# Patient Record
Sex: Female | Born: 1995 | Race: Black or African American | Hispanic: No | Marital: Single | Smoking: Never smoker
Health system: Southern US, Community
[De-identification: ages and names within clinical notes are randomized; demographics above are authoritative.]

## PROBLEM LIST (undated history)

## (undated) DIAGNOSIS — J302 Other seasonal allergic rhinitis: Secondary | ICD-10-CM

## (undated) DIAGNOSIS — F32A Depression, unspecified: Secondary | ICD-10-CM

## (undated) DIAGNOSIS — F411 Generalized anxiety disorder: Secondary | ICD-10-CM

## (undated) DIAGNOSIS — F329 Major depressive disorder, single episode, unspecified: Secondary | ICD-10-CM

## (undated) HISTORY — DX: Generalized anxiety disorder: F41.1

## (undated) HISTORY — DX: Other seasonal allergic rhinitis: J30.2

## (undated) HISTORY — PX: WISDOM TOOTH EXTRACTION: SHX21

---

## 2016-08-20 DIAGNOSIS — J101 Influenza due to other identified influenza virus with other respiratory manifestations: Secondary | ICD-10-CM | POA: Diagnosis not present

## 2016-08-22 DIAGNOSIS — J111 Influenza due to unidentified influenza virus with other respiratory manifestations: Secondary | ICD-10-CM | POA: Diagnosis not present

## 2016-08-25 ENCOUNTER — Emergency Department (HOSPITAL_BASED_OUTPATIENT_CLINIC_OR_DEPARTMENT_OTHER)
Admission: EM | Admit: 2016-08-25 | Discharge: 2016-08-26 | Disposition: A | Discharge status: Home / Self Care | Payer: BLUE CROSS/BLUE SHIELD | Attending: Emergency Medicine | Admitting: Emergency Medicine

## 2016-08-25 ENCOUNTER — Encounter (HOSPITAL_BASED_OUTPATIENT_CLINIC_OR_DEPARTMENT_OTHER): Payer: Self-pay

## 2016-08-25 ENCOUNTER — Emergency Department (HOSPITAL_BASED_OUTPATIENT_CLINIC_OR_DEPARTMENT_OTHER): Payer: BLUE CROSS/BLUE SHIELD

## 2016-08-25 DIAGNOSIS — R0602 Shortness of breath: Secondary | ICD-10-CM | POA: Diagnosis not present

## 2016-08-25 DIAGNOSIS — N3000 Acute cystitis without hematuria: Secondary | ICD-10-CM

## 2016-08-25 DIAGNOSIS — Z79899 Other long term (current) drug therapy: Secondary | ICD-10-CM | POA: Diagnosis not present

## 2016-08-25 DIAGNOSIS — R079 Chest pain, unspecified: Secondary | ICD-10-CM | POA: Diagnosis not present

## 2016-08-25 DIAGNOSIS — R05 Cough: Secondary | ICD-10-CM | POA: Diagnosis not present

## 2016-08-25 HISTORY — DX: Depression, unspecified: F32.A

## 2016-08-25 HISTORY — DX: Major depressive disorder, single episode, unspecified: F32.9

## 2016-08-25 LAB — CBC WITH DIFFERENTIAL/PLATELET
Basophils Absolute: 0 10*3/uL (ref 0.0–0.1)
Basophils Relative: 0 %
EOS PCT: 0 %
Eosinophils Absolute: 0 10*3/uL (ref 0.0–0.7)
HEMATOCRIT: 34.1 % — AB (ref 36.0–46.0)
HEMOGLOBIN: 11.5 g/dL — AB (ref 12.0–15.0)
LYMPHS PCT: 18 %
Lymphs Abs: 2.3 10*3/uL (ref 0.7–4.0)
MCH: 27.6 pg (ref 26.0–34.0)
MCHC: 33.7 g/dL (ref 30.0–36.0)
MCV: 82 fL (ref 78.0–100.0)
MONOS PCT: 11 %
Monocytes Absolute: 1.4 10*3/uL — ABNORMAL HIGH (ref 0.1–1.0)
NEUTROS PCT: 71 %
Neutro Abs: 8.8 10*3/uL — ABNORMAL HIGH (ref 1.7–7.7)
Platelets: 268 10*3/uL (ref 150–400)
RBC: 4.16 MIL/uL (ref 3.87–5.11)
RDW: 12.5 % (ref 11.5–15.5)
WBC: 12.5 10*3/uL — ABNORMAL HIGH (ref 4.0–10.5)

## 2016-08-25 LAB — COMPREHENSIVE METABOLIC PANEL
ALT: 18 U/L (ref 14–54)
ANION GAP: 8 (ref 5–15)
AST: 21 U/L (ref 15–41)
Albumin: 3.3 g/dL — ABNORMAL LOW (ref 3.5–5.0)
Alkaline Phosphatase: 64 U/L (ref 38–126)
BUN: 5 mg/dL — AB (ref 6–20)
CHLORIDE: 101 mmol/L (ref 101–111)
CO2: 28 mmol/L (ref 22–32)
Calcium: 8.7 mg/dL — ABNORMAL LOW (ref 8.9–10.3)
Creatinine, Ser: 0.73 mg/dL (ref 0.44–1.00)
Glucose, Bld: 112 mg/dL — ABNORMAL HIGH (ref 65–99)
POTASSIUM: 4 mmol/L (ref 3.5–5.1)
Sodium: 137 mmol/L (ref 135–145)
TOTAL PROTEIN: 7.7 g/dL (ref 6.5–8.1)
Total Bilirubin: 0.4 mg/dL (ref 0.3–1.2)

## 2016-08-25 LAB — I-STAT CG4 LACTIC ACID, ED: LACTIC ACID, VENOUS: 1.15 mmol/L (ref 0.5–1.9)

## 2016-08-25 MED ORDER — ACETAMINOPHEN 325 MG PO TABS
ORAL_TABLET | ORAL | Status: AC
  Filled 2016-08-25: qty 1

## 2016-08-25 MED ORDER — ACETAMINOPHEN 325 MG PO TABS
650.0000 mg | ORAL_TABLET | Freq: Once | ORAL | Status: AC
  Administered 2016-08-25: 650 mg via ORAL
  Filled 2016-08-25: qty 2

## 2016-08-25 NOTE — ED Triage Notes (Signed)
C/o flu like s/s x 8 days-seen by PCP-neg flu test but was started on tamiflu-did not compete med-NAD-steady gait

## 2016-08-25 NOTE — ED Provider Notes (Signed)
[facility] Provider Note   CSN: [id number] Arrival date & time: 08/25/16  2055   By signing my name below, I, Vanessa Christian, attest that this documentation has been prepared under the direction and in the presence of Vanessa Morn, NP Electronically Signed: Soijett Christian, ED Scribe. 08/25/16. 11:51 PM.  History   Chief Complaint Chief Complaint  Patient presents with  . Cough    HPI Vanessa Christian is a 21 y.o. female who presents to the Emergency Department complaining of productive cough onset 8 days ago. Pt reports associated fever of 103, chills, nausea, vomiting, HA, CP, back pain, SOB, appetite change, and generalized body aches. Pt has tried tamiflu with no relief of her symptoms. Pt notes that she was evaluated at her PCP for her symptoms and had a negative flu swab at the time. Pt states that she was treated with tamiflu to which she didn't complete the prescription. Mother states that the pt was evaluated at [facility] as well for her symptoms. She denies sore throat and any other symptoms. Mother notes that the pt is allergic to penicillin.   The history is provided by the patient and a parent. No language interpreter was used.  Cough  This is a new problem. The current episode started more than 1 week ago. The problem occurs constantly. The problem has not changed since onset.The cough is productive of sputum. The maximum temperature recorded prior to her arrival was 103 to 104 F. Associated symptoms include chest pain, chills, myalgias and shortness of breath. Pertinent negatives include no sore throat. Treatments tried: tamiflu. The treatment provided no relief.    Past Medical History:  Diagnosis Date  . Depression     There are no active problems to display for this patient.   Past Surgical History:  Procedure Laterality Date  . WISDOM TOOTH EXTRACTION      OB History    No data available       Home Medications    Prior to Admission  medications   Medication Sig Start Date End Date Taking? Authorizing Provider  Citalopram Hydrobromide (CELEXA PO) Take by mouth.   Yes Historical Provider, MD  MELOXICAM PO Take by mouth.   Yes Historical Provider, MD    Family History No family history on file.  Social History Social History  Substance Use Topics  . Smoking status: Never Smoker  . Smokeless tobacco: Never Used  . Alcohol use No     Allergies   Penicillins   Review of Systems Review of Systems  Constitutional: Positive for appetite change, chills and fever.  HENT: Negative for sore throat.   Respiratory: Positive for cough and shortness of breath.   Cardiovascular: Positive for chest pain.  Gastrointestinal: Positive for nausea and vomiting.  Musculoskeletal: Positive for back pain and myalgias.  All other systems reviewed and are negative.    Physical Exam Updated Vital Signs BP 135/65 (BP Location: Right Arm)   Pulse 104   Temp (!) 103.9 F (39.9 C) (Oral)   Resp (!) 30   LMP 08/13/2016   SpO2 100%   Physical Exam  Constitutional: She is oriented to person, place, and time. She appears well-developed and well-nourished. No distress.  HENT:  Head: Normocephalic and atraumatic.  Mouth/Throat: Uvula is midline, oropharynx is clear and moist and mucous membranes are normal.  Eyes: EOM are normal.  Neck: Neck supple.  Cardiovascular: Regular rhythm and normal heart sounds.  Tachycardia present.  Exam reveals no gallop  and no friction rub.   No murmur heard. Pulmonary/Chest: Effort normal and breath sounds normal. No respiratory distress. She has no wheezes. She has no rales.  Abdominal: Soft. She exhibits no distension. There is no tenderness.  Musculoskeletal: Normal range of motion.  Neurological: She is alert and oriented to person, place, and time.  Skin: Skin is warm and dry.  Hot to touch  Psychiatric: She has a normal mood and affect. Her behavior is normal.  Nursing note and vitals  reviewed.    ED Treatments / Results  DIAGNOSTIC STUDIES: Oxygen Saturation is 100% on RA, nl by my interpretation.    COORDINATION OF CARE: 10:53 PM Discussed treatment plan with pt at bedside which includes labs, UA, CXR, and pt agreed to plan.   Labs (all labs ordered are listed, but only abnormal results are displayed) Labs Reviewed  COMPREHENSIVE METABOLIC PANEL - Abnormal; Notable for the following:       Result Value   Glucose, Bld 112 (*)    BUN 5 (*)    Calcium 8.7 (*)    Albumin 3.3 (*)    All other components within normal limits  CBC WITH DIFFERENTIAL/PLATELET - Abnormal; Notable for the following:    WBC 12.5 (*)    Hemoglobin 11.5 (*)    HCT 34.1 (*)    Neutro Abs 8.8 (*)    Monocytes Absolute 1.4 (*)    All other components within normal limits  URINALYSIS, ROUTINE W REFLEX MICROSCOPIC - Abnormal; Notable for the following:    APPearance CLOUDY (*)    Hgb urine dipstick TRACE (*)    Nitrite POSITIVE (*)    Leukocytes, UA LARGE (*)    All other components within normal limits  URINALYSIS, MICROSCOPIC (REFLEX) - Abnormal; Notable for the following:    Bacteria, UA MANY (*)    Squamous Epithelial / LPF 0-5 (*)    All other components within normal limits  I-STAT CG4 LACTIC ACID, ED    Radiology Dg Chest 2 View  Result Date: 08/25/2016 CLINICAL DATA:  Flu-like symptoms for 8 days.  Cough. EXAM: CHEST  2 VIEW COMPARISON:  None. FINDINGS: The heart size and mediastinal contours are within normal limits. Both lungs are clear. The visualized skeletal structures are unremarkable. IMPRESSION: No active cardiopulmonary disease. Electronically Signed   By: Burman NievesWilliam  Stevens M.D.   On: 08/25/2016 22:11    Procedures Procedures (including critical care time)  Medications Ordered in ED Medications  acetaminophen (TYLENOL) 325 MG tablet (not administered)  acetaminophen (TYLENOL) tablet 650 mg (650 mg Oral Given 08/25/16 2224)     Initial Impression / Assessment  and Plan / ED Course  I have reviewed the triage vital signs and the nursing notes.  Pertinent labs & imaging results that were available during my care of the patient were reviewed by me and considered in my medical decision making (see chart for details).    Pt diagnosed with a UTI and viral illness. Tachycardia and fever improved with treatment in ED. Pt to be dc home with antibiotics and instructions to follow up with PCP if symptoms persist. Discussed return precautions. Pt appears safe for discharge.    Final Clinical Impressions(s) / ED Diagnoses   Final diagnoses:  Acute cystitis without hematuria    New Prescriptions New Prescriptions   CEPHALEXIN (KEFLEX) 500 MG CAPSULE    Take 1 capsule (500 mg total) by mouth 4 (four) times daily.   ONDANSETRON (ZOFRAN ODT) 4 MG DISINTEGRATING TABLET  4mg  ODT q4 hours prn nausea/vomit   I personally performed the services described in this documentation, which was scribed in my presence. The recorded information has been reviewed and is accurate.     Vanessa Morn, NP 08/26/16 1610    Charlynne Pander, MD 08/28/16 [phone]

## 2016-08-25 NOTE — ED Notes (Signed)
ED Provider at bedside. 

## 2016-08-26 LAB — URINALYSIS, ROUTINE W REFLEX MICROSCOPIC
Bilirubin Urine: NEGATIVE
GLUCOSE, UA: NEGATIVE mg/dL
KETONES UR: NEGATIVE mg/dL
Nitrite: POSITIVE — AB
PH: 6.5 (ref 5.0–8.0)
Protein, ur: NEGATIVE mg/dL
Specific Gravity, Urine: 1.01 (ref 1.005–1.030)

## 2016-08-26 LAB — URINALYSIS, MICROSCOPIC (REFLEX)

## 2016-08-26 MED ORDER — CEPHALEXIN 500 MG PO CAPS: 500.0000 mg | ORAL_CAPSULE | Freq: Four times a day (QID) | ORAL | 0 refills | Status: DC

## 2016-08-26 MED ORDER — ONDANSETRON 4 MG PO TBDP: ORAL_TABLET | ORAL | 0 refills | Status: DC

## 2016-11-02 DIAGNOSIS — N760 Acute vaginitis: Secondary | ICD-10-CM | POA: Diagnosis not present

## 2016-11-04 DIAGNOSIS — M542 Cervicalgia: Secondary | ICD-10-CM | POA: Diagnosis not present

## 2016-11-04 DIAGNOSIS — N62 Hypertrophy of breast: Secondary | ICD-10-CM | POA: Diagnosis not present

## 2016-11-04 DIAGNOSIS — M549 Dorsalgia, unspecified: Secondary | ICD-10-CM | POA: Diagnosis not present

## 2016-11-04 DIAGNOSIS — L304 Erythema intertrigo: Secondary | ICD-10-CM | POA: Insufficient documentation

## 2016-11-17 DIAGNOSIS — E668 Other obesity: Secondary | ICD-10-CM | POA: Diagnosis not present

## 2016-11-17 DIAGNOSIS — B373 Candidiasis of vulva and vagina: Secondary | ICD-10-CM | POA: Diagnosis not present

## 2016-11-25 DIAGNOSIS — R875 Abnormal microbiological findings in specimens from female genital organs: Secondary | ICD-10-CM | POA: Diagnosis not present

## 2016-11-25 DIAGNOSIS — N939 Abnormal uterine and vaginal bleeding, unspecified: Secondary | ICD-10-CM | POA: Diagnosis not present

## 2016-11-25 DIAGNOSIS — R87610 Atypical squamous cells of undetermined significance on cytologic smear of cervix (ASC-US): Secondary | ICD-10-CM | POA: Diagnosis not present

## 2017-01-07 DIAGNOSIS — E668 Other obesity: Secondary | ICD-10-CM | POA: Diagnosis not present

## 2017-01-07 DIAGNOSIS — F411 Generalized anxiety disorder: Secondary | ICD-10-CM | POA: Diagnosis not present

## 2017-01-12 DIAGNOSIS — N342 Other urethritis: Secondary | ICD-10-CM | POA: Diagnosis not present

## 2017-02-15 DIAGNOSIS — F331 Major depressive disorder, recurrent, moderate: Secondary | ICD-10-CM | POA: Diagnosis not present

## 2017-03-09 DIAGNOSIS — G43119 Migraine with aura, intractable, without status migrainosus: Secondary | ICD-10-CM | POA: Diagnosis not present

## 2017-03-29 DIAGNOSIS — F331 Major depressive disorder, recurrent, moderate: Secondary | ICD-10-CM | POA: Diagnosis not present

## 2017-05-03 DIAGNOSIS — J209 Acute bronchitis, unspecified: Secondary | ICD-10-CM | POA: Diagnosis not present

## 2017-06-21 DIAGNOSIS — F331 Major depressive disorder, recurrent, moderate: Secondary | ICD-10-CM | POA: Diagnosis not present

## 2018-01-06 IMAGING — CR DG CHEST 2V
2 series · 2 of 2 positions shown · non-contrast
Comparison: None.

CLINICAL DATA: Flu-like symptoms for 8 days.  Cough.

EXAM:
CHEST  2 VIEW

[w chest pa]
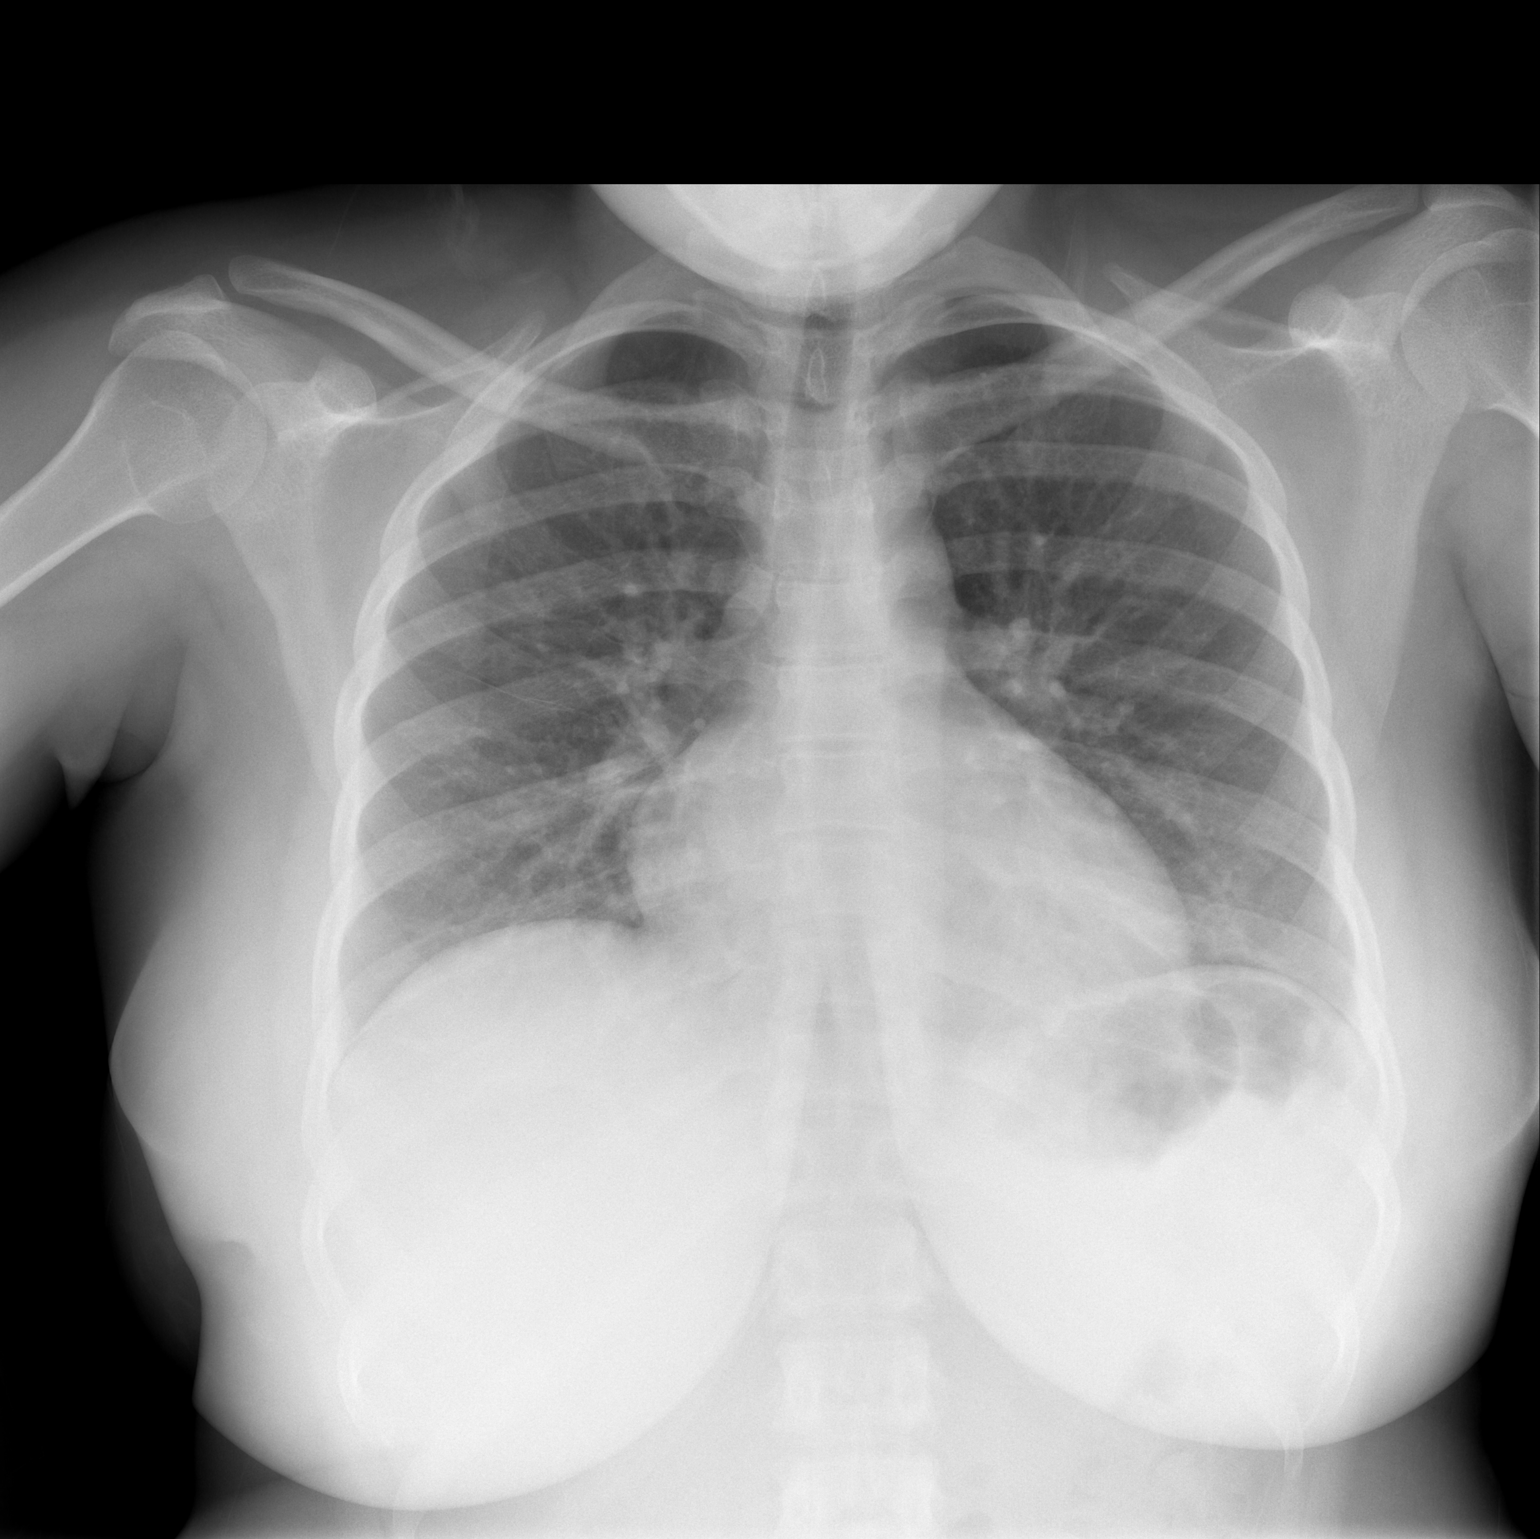

[w chest lat]
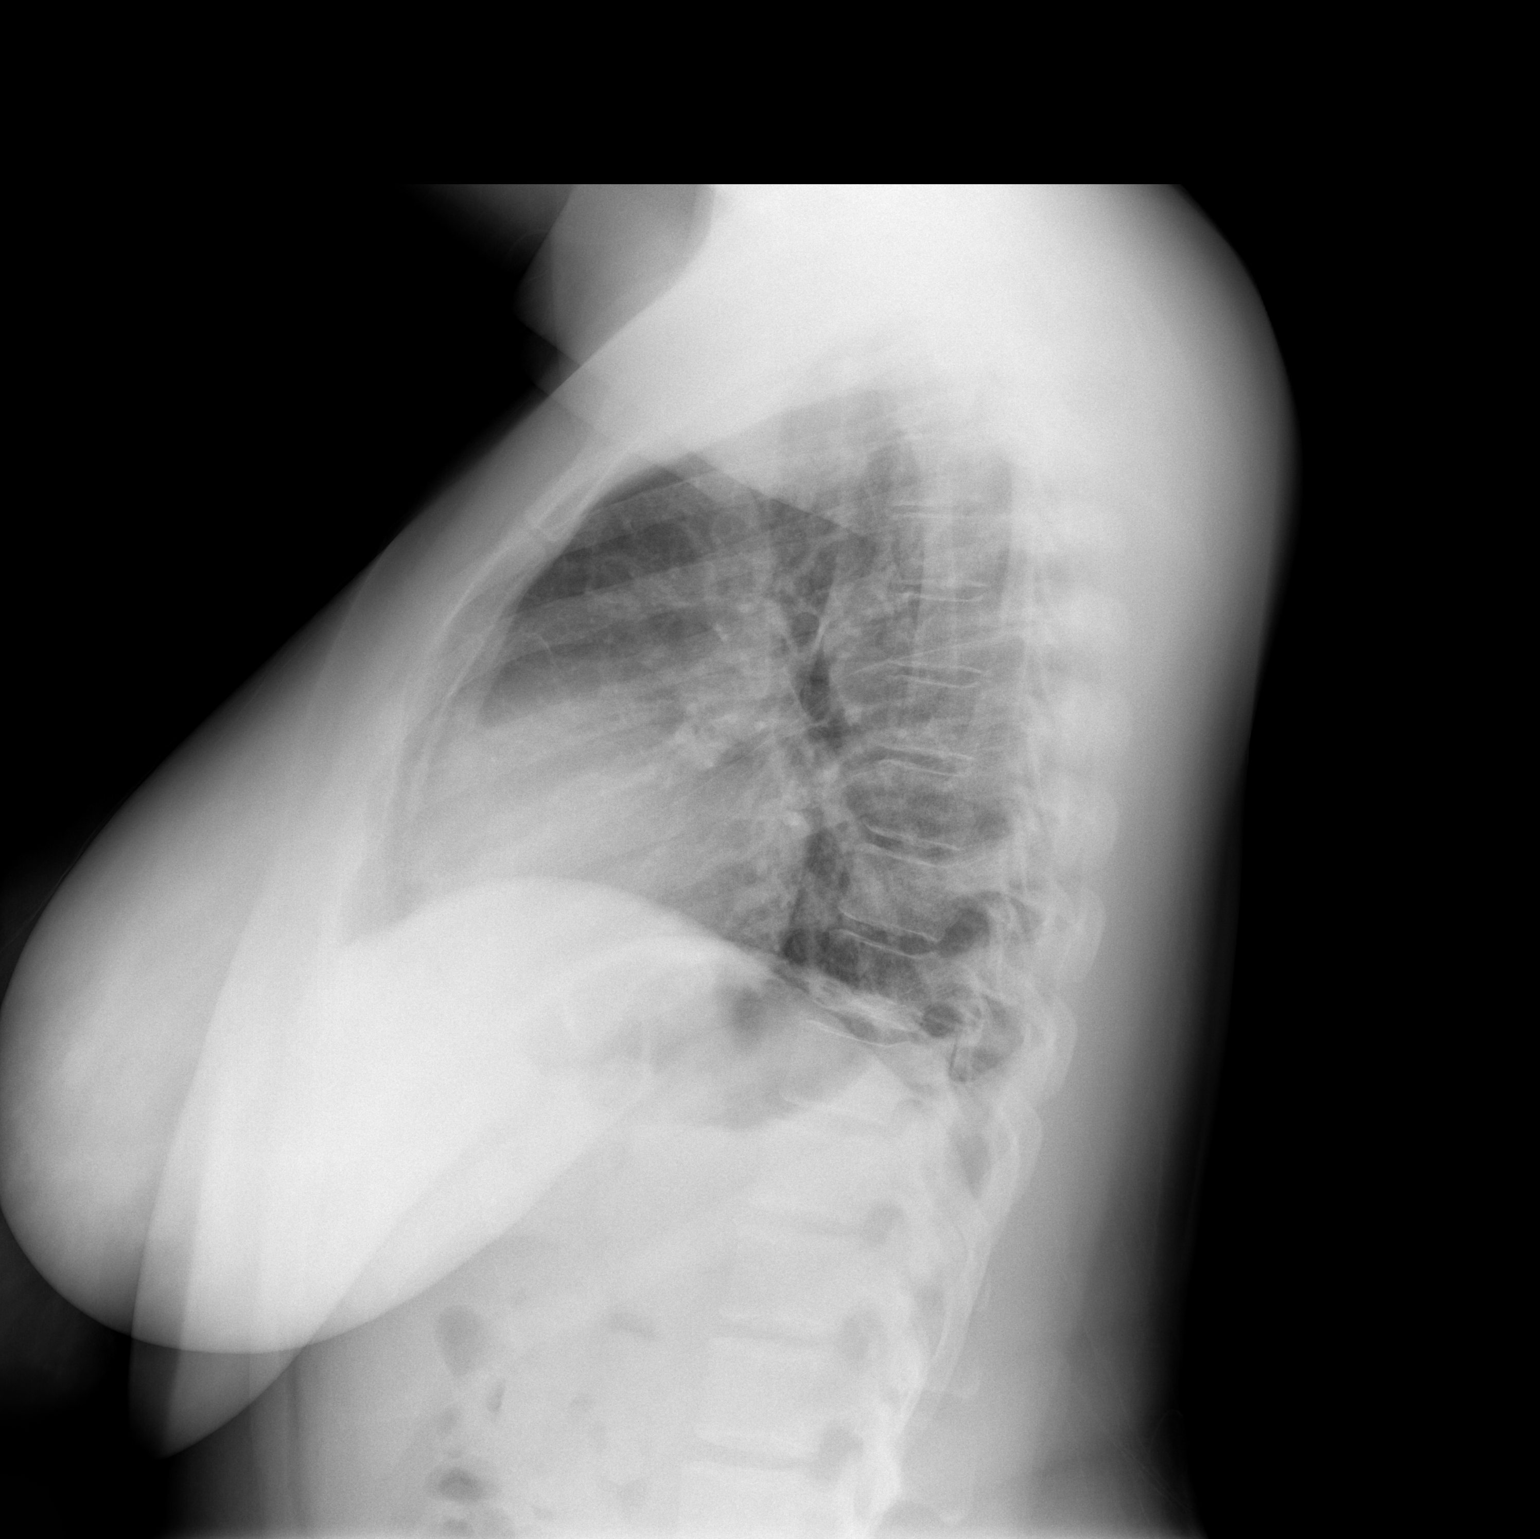

[2 of 2 positions shown; findings below may reference images not displayed]

FINDINGS: The heart size and mediastinal contours are within normal limits.
Both lungs are clear. The visualized skeletal structures are
unremarkable.
IMPRESSION: No active cardiopulmonary disease.

## 2018-02-02 DIAGNOSIS — N3000 Acute cystitis without hematuria: Secondary | ICD-10-CM | POA: Diagnosis not present

## 2018-02-02 DIAGNOSIS — B373 Candidiasis of vulva and vagina: Secondary | ICD-10-CM | POA: Diagnosis not present

## 2018-02-02 DIAGNOSIS — N760 Acute vaginitis: Secondary | ICD-10-CM | POA: Diagnosis not present

## 2018-03-11 DIAGNOSIS — J019 Acute sinusitis, unspecified: Secondary | ICD-10-CM | POA: Diagnosis not present

## 2018-03-11 DIAGNOSIS — H6693 Otitis media, unspecified, bilateral: Secondary | ICD-10-CM | POA: Diagnosis not present

## 2018-03-30 DIAGNOSIS — N912 Amenorrhea, unspecified: Secondary | ICD-10-CM | POA: Diagnosis not present

## 2018-04-11 DIAGNOSIS — N760 Acute vaginitis: Secondary | ICD-10-CM | POA: Diagnosis not present

## 2018-06-13 DIAGNOSIS — Z0000 Encounter for general adult medical examination without abnormal findings: Secondary | ICD-10-CM | POA: Diagnosis not present

## 2018-06-13 DIAGNOSIS — F411 Generalized anxiety disorder: Secondary | ICD-10-CM | POA: Diagnosis not present

## 2018-06-13 DIAGNOSIS — Z1331 Encounter for screening for depression: Secondary | ICD-10-CM | POA: Diagnosis not present

## 2018-06-13 DIAGNOSIS — R011 Cardiac murmur, unspecified: Secondary | ICD-10-CM | POA: Diagnosis not present

## 2018-06-13 DIAGNOSIS — K5900 Constipation, unspecified: Secondary | ICD-10-CM | POA: Diagnosis not present

## 2018-08-18 ENCOUNTER — Emergency Department (HOSPITAL_BASED_OUTPATIENT_CLINIC_OR_DEPARTMENT_OTHER)
Admission: EM | Admit: 2018-08-18 | Discharge: 2018-08-19 | Disposition: A | Discharge status: Home / Self Care | Payer: BLUE CROSS/BLUE SHIELD | Attending: Emergency Medicine | Admitting: Emergency Medicine

## 2018-08-18 ENCOUNTER — Other Ambulatory Visit: Payer: Self-pay

## 2018-08-18 ENCOUNTER — Encounter (HOSPITAL_BASED_OUTPATIENT_CLINIC_OR_DEPARTMENT_OTHER): Payer: Self-pay | Admitting: Emergency Medicine

## 2018-08-18 DIAGNOSIS — R102 Pelvic and perineal pain: Secondary | ICD-10-CM | POA: Insufficient documentation

## 2018-08-18 DIAGNOSIS — N939 Abnormal uterine and vaginal bleeding, unspecified: Secondary | ICD-10-CM | POA: Insufficient documentation

## 2018-08-18 DIAGNOSIS — Z79899 Other long term (current) drug therapy: Secondary | ICD-10-CM | POA: Insufficient documentation

## 2018-08-18 NOTE — ED Provider Notes (Signed)
[facility] EMERGENCY DEPARTMENT Provider Note   CSN: [id number] Arrival date & time: 08/18/18  2251     History   Chief Complaint Chief Complaint  Patient presents with  . Vaginal Bleeding    HPI Vanessa Christian is a 23 y.o. female.  Patient presents to the emergency department for evaluation of vaginal bleeding.  Patient reports that she has been experiencing heavy vaginal bleeding for the last few weeks.  She was on a birth control patch until 2018.  She reports that when she was on the patch her periods were very irregular, but became more regular when she stopped.  She is not currently on any birth control.  Over the last few weeks she has been experiencing daily bleeding, intermittent passage of clots with pelvic cramping.  No dizziness, passing out, shortness of breath.     Past Medical History:  Diagnosis Date  . Depression     There are no active problems to display for this patient.   Past Surgical History:  Procedure Laterality Date  . WISDOM TOOTH EXTRACTION       OB History   No obstetric history on file.      Home Medications    Prior to Admission medications   Medication Sig Start Date End Date Taking? Authorizing Provider  cephALEXin (KEFLEX) 500 MG capsule Take 1 capsule (500 mg total) by mouth 4 (four) times daily. 08/26/16   [name], NP  Citalopram Hydrobromide (CELEXA PO) Take by mouth.    [provider]  MELOXICAM PO Take by mouth.    [provider]  ondansetron (ZOFRAN ODT) 4 MG disintegrating tablet 4mg  ODT q4 hours prn nausea/vomit 08/26/16   [name], NP    Family History Family History  Problem Relation Age of Onset  . Diabetes Other   . Hypertension Other     Social History Social History   Tobacco Use  . Smoking status: Never Smoker  . Smokeless tobacco: Never Used  Substance Use Topics  . Alcohol use: No  . Drug use: No     Allergies   Penicillins   Review of Systems Review of  Systems  Genitourinary: Positive for pelvic pain and vaginal bleeding.  All other systems reviewed and are negative.    Physical Exam Updated Vital Signs BP 120/78 (BP Location: Left Arm)   Pulse (!) 57   Temp 97.8 F (36.6 C) (Oral)   Ht 5' (1.524 m)   Wt 91.8 kg   SpO2 100%   BMI 39.51 kg/m   Physical Exam Vitals signs and nursing note reviewed.  Constitutional:      General: She is not in acute distress.    Appearance: Normal appearance. She is well-developed.  HENT:     Head: Normocephalic and atraumatic.     Right Ear: Hearing normal.     Left Ear: Hearing normal.     Nose: Nose normal.  Eyes:     Conjunctiva/sclera: Conjunctivae normal.     Pupils: Pupils are equal, round, and reactive to light.  Neck:     Musculoskeletal: Normal range of motion and neck supple.  Cardiovascular:     Rate and Rhythm: Regular rhythm.     Heart sounds: S1 normal and S2 normal. No murmur. No friction rub. No gallop.   Pulmonary:     Effort: Pulmonary effort is normal. No respiratory distress.     Breath sounds: Normal breath sounds.  Chest:     Chest wall: No  tenderness.  Abdominal:     General: Bowel sounds are normal.     Palpations: Abdomen is soft.     Tenderness: There is no abdominal tenderness. There is no guarding or rebound. Negative signs include Murphy's sign and McBurney's sign.     Hernia: No hernia is present.  Genitourinary:    Cervix: No cervical motion tenderness.     Adnexa: Right adnexa normal and left adnexa normal.     Comments: Closed cervix, no cervical motion tenderness, no discharge.  Blood in the vagina, no clots. Musculoskeletal: Normal range of motion.  Skin:    General: Skin is warm and dry.     Findings: No rash.  Neurological:     Mental Status: She is alert and oriented to person, place, and time.     GCS: GCS eye subscore is 4. GCS verbal subscore is 5. GCS motor subscore is 6.     Cranial Nerves: No cranial nerve deficit.     Sensory: No  sensory deficit.     Coordination: Coordination normal.  Psychiatric:        Speech: Speech normal.        Behavior: Behavior normal.        Thought Content: Thought content normal.      ED Treatments / Results  Labs (all labs ordered are listed, but only abnormal results are displayed) Labs Reviewed  CBC - Abnormal; Notable for the following components:      Result Value   Hemoglobin 11.6 (*)    All other components within normal limits  WET PREP, GENITAL  PREGNANCY, URINE  GC/CHLAMYDIA PROBE AMP ([place]) NOT AT [facility]    EKG None  Radiology No results found.  Procedures Procedures (including critical care time)  Medications Ordered in ED Medications - No data to display   Initial Impression / Assessment and Plan / ED Course  I have reviewed the triage vital signs and the nursing notes.  Pertinent labs & imaging results that were available during my care of the patient were reviewed by me and considered in my medical decision making (see chart for details).     Patient presents to the emergency department for evaluation of heavy vaginal bleeding.  Symptoms have been ongoing for several weeks.  Examination today reveals evidence of ongoing bleeding but no heavy bleeding at this time.  Hemoglobin is essentially normal.  Vital signs are unremarkable.  Patient has follow-up with PCP tomorrow.  Will treat with Provera, NSAIDs, Ultram.  Final Clinical Impressions(s) / ED Diagnoses   Final diagnoses:  Abnormal uterine bleeding (AUB)    ED Discharge Orders    None       Gilda CreasePollina, Layla Gramm J, MD 08/19/18 [phone]

## 2018-08-18 NOTE — ED Triage Notes (Signed)
Pt states she has been having irregular periods  States she has been bleeding heavily for the past 2 weeks and passing clots  Pt states she is going through tampons and pads and leaking through her clothes  Pt states she has been bleeding about 90 % of this month

## 2018-08-19 LAB — CBC
HEMATOCRIT: 36.1 % (ref 36.0–46.0)
Hemoglobin: 11.6 g/dL — ABNORMAL LOW (ref 12.0–15.0)
MCH: 27.3 pg (ref 26.0–34.0)
MCHC: 32.1 g/dL (ref 30.0–36.0)
MCV: 84.9 fL (ref 80.0–100.0)
NRBC: 0 % (ref 0.0–0.2)
PLATELETS: 239 10*3/uL (ref 150–400)
RBC: 4.25 MIL/uL (ref 3.87–5.11)
RDW: 13.2 % (ref 11.5–15.5)
WBC: 6.7 10*3/uL (ref 4.0–10.5)

## 2018-08-19 LAB — WET PREP, GENITAL
CLUE CELLS WET PREP: NONE SEEN
SPERM: NONE SEEN
TRICH WET PREP: NONE SEEN
Yeast Wet Prep HPF POC: NONE SEEN

## 2018-08-19 LAB — PREGNANCY, URINE: Preg Test, Ur: NEGATIVE

## 2018-08-19 MED ORDER — IBUPROFEN 800 MG PO TABS: 800.0000 mg | ORAL_TABLET | Freq: Four times a day (QID) | ORAL | 0 refills | Status: DC | PRN

## 2018-08-19 MED ORDER — MEDROXYPROGESTERONE ACETATE 10 MG PO TABS: 10.0000 mg | ORAL_TABLET | Freq: Every day | ORAL | 0 refills | Status: AC

## 2018-08-19 MED ORDER — TRAMADOL HCL 50 MG PO TABS: 50.0000 mg | ORAL_TABLET | Freq: Four times a day (QID) | ORAL | 0 refills | Status: DC | PRN

## 2018-08-22 LAB — GC/CHLAMYDIA PROBE AMP (~~LOC~~) NOT AT ARMC
CHLAMYDIA, DNA PROBE: NEGATIVE
Neisseria Gonorrhea: NEGATIVE

## 2022-08-12 ENCOUNTER — Ambulatory Visit: Payer: 59 | Admitting: Internal Medicine

## 2022-08-25 ENCOUNTER — Ambulatory Visit: Payer: 59 | Admitting: Internal Medicine

## 2022-08-31 ENCOUNTER — Ambulatory Visit: Payer: 59 | Admitting: Internal Medicine

## 2022-09-02 ENCOUNTER — Ambulatory Visit: Payer: 59 | Admitting: Internal Medicine

## 2022-09-02 ENCOUNTER — Encounter: Payer: Self-pay | Admitting: Internal Medicine

## 2022-09-02 VITALS — BP 118/74 | HR 74 | Temp 97.3°F | Resp 18 | Ht 60.0 in | Wt 213.0 lb

## 2022-09-02 DIAGNOSIS — Z6841 Body Mass Index (BMI) 40.0 and over, adult: Secondary | ICD-10-CM

## 2022-09-02 DIAGNOSIS — F410 Panic disorder [episodic paroxysmal anxiety] without agoraphobia: Secondary | ICD-10-CM

## 2022-09-02 DIAGNOSIS — F411 Generalized anxiety disorder: Secondary | ICD-10-CM | POA: Diagnosis not present

## 2022-09-02 DIAGNOSIS — E669 Obesity, unspecified: Secondary | ICD-10-CM | POA: Insufficient documentation

## 2022-09-02 MED ORDER — HYDROXYZINE PAMOATE 25 MG PO CAPS: 25.0000 mg | ORAL_CAPSULE | Freq: Every day | ORAL | 1 refills | Status: AC | PRN

## 2022-09-02 MED ORDER — DULOXETINE HCL 30 MG PO CPEP: 30.0000 mg | ORAL_CAPSULE | Freq: Every day | ORAL | 1 refills | Status: DC

## 2022-09-02 MED ORDER — PHENTERMINE HCL 37.5 MG PO CAPS: ORAL_CAPSULE | ORAL | 1 refills | Status: DC

## 2022-09-02 NOTE — Assessment & Plan Note (Signed)
Plan as above.  

## 2022-09-02 NOTE — Progress Notes (Signed)
Office Visit  Subjective   Patient ID: Vanessa Christian   DOB: 08-29-1995   Age: 27 y.o.   MRN: [id number]   Chief Complaint Chief Complaint  Patient presents with   Follow-up    Talk about weight loss     History of Present Illness The patient is a 27 year old African American/Black female who presents returns for followup of her generalized anxiety disorder.  She states she was diagnosed with GAD around 2017 and was started on medications at that time.  Today, she states her anxiety has worsened a bit where she states her anxiety is moderate and she is having about one panic attack per week.  She was on Bupropion XL 164m daily but she states that my NP had started her on duloxetine 271mdaily.  However, the patient tells me today that she had the Bupriopion "accidentally filled and was taking that" but ran out the medication 2-3 weeks ago.  She denies any side effects from her bupropion.  She states that she is having some depression with decreased concentration along with fatigue, and she does have some social withdrawal and loss of pleasure in things she likes.  She denies any feelings of guilt, isolation or worthlessness, no insomnia, weight loss or loss of appetite, suicidal ideation, homicidal ideation. This patient feels that she is able to care for herself. She currently lives with her roommate.  She does have a therapist but has not seen them in a while but she has an appointment coming up on 09/10/2022.  Patient is a [age]ear old African American/Black female who presents today to discuss weight management.  She has been on and off adipex for the last several years.  She has been on adipex 37.60m23maily but has not had a refill in several months.  She states that she has noted some irritability with use of adipex and is requesting to go to 1/2 dose.  She has not had any other side effects from her adipex.   She does watch what she eats and denies any soft drinks or sweet tea.  She does  exercise by weight lifting but does not do cardio.       Past Medical History Past Medical History:  Diagnosis Date   Depression    GAD (generalized anxiety disorder)    Seasonal allergies      Allergies Allergies  Allergen Reactions   Penicillins Rash     Medications  Current Outpatient Medications:    cetirizine (ZYRTEC) 10 MG tablet, Take 10 mg by mouth daily., Disp: , Rfl:    ibuprofen (ADVIL,MOTRIN) 800 MG tablet, Take 1 tablet (800 mg total) by mouth every 6 (six) hours as needed for moderate pain., Disp: 20 tablet, Rfl: 0   medroxyPROGESTERone (PROVERA) 10 MG tablet, Take 1 tablet (10 mg total) by mouth daily., Disp: 7 tablet, Rfl: 0   ondansetron (ZOFRAN ODT) 4 MG disintegrating tablet, 4mg16mT q4 hours prn nausea/vomit, Disp: 4 tablet, Rfl: 0   Review of Systems Review of Systems  Constitutional:  Negative for chills and fever.  Respiratory:  Negative for shortness of breath.   Cardiovascular:  Negative for chest pain, palpitations and leg swelling.  Gastrointestinal:  Negative for abdominal pain, constipation, diarrhea, nausea and vomiting.  Musculoskeletal:  Negative for myalgias.  Neurological:  Negative for dizziness, weakness and headaches.  Psychiatric/Behavioral:  Negative for suicidal ideas.        Objective:    Vitals BP 118/74 (BP  Location: Right Arm, Patient Position: Sitting, Cuff Size: Normal)   Pulse 74   Temp (!) 97.3 F (36.3 C) (Temporal)   Resp 18   Ht 5' (1.524 m)   Wt 213 lb (96.6 kg)   SpO2 96%   BMI 41.60 kg/m    Physical Examination Physical Exam Constitutional:      Appearance: Normal appearance. She is not ill-appearing.  Cardiovascular:     Rate and Rhythm: Normal rate and regular rhythm.     Pulses: Normal pulses.     Heart sounds: No murmur heard.    No friction rub. No gallop.  Pulmonary:     Effort: Pulmonary effort is normal. No respiratory distress.     Breath sounds: No wheezing, rhonchi or rales.  Abdominal:      General: Bowel sounds are normal. There is no distension.     Palpations: Abdomen is soft.     Tenderness: There is no abdominal tenderness.  Musculoskeletal:     Right lower leg: No edema.     Left lower leg: No edema.  Skin:    General: Skin is warm and dry.     Findings: No rash.  Neurological:     Mental Status: She is alert.        Assessment & Plan:   BMI 40.0-44.9, adult ([place]) She has morbid obesity where we will restart her on adipex but 1/2 dose daily.  I want her to do more cardio with her exercise and continue to eat healthy.  Our goal will be to lose 2-3 lbs per month with her medication and lifestyle modification.  GAD (generalized anxiety disorder) I think her anxiety and panic attacks have worsened as she has not been on medications for the last 2-3 weeks.  We will start her on cymbalta 79m daily and use hydroxyzine as needed for panic attacks.  I will see her back in 6-8 weeks.  Morbid obesity ([place] Plan as above.  Panic disorder Plan as above.    Return in about 3 months (around 12/01/2022).   JTownsend Roger MD

## 2022-09-02 NOTE — Assessment & Plan Note (Signed)
I think her anxiety and panic attacks have worsened as she has not been on medications for the last 2-3 weeks.  We will start her on cymbalta 8m daily and use hydroxyzine as needed for panic attacks.  I will see her back in 6-8 weeks.

## 2022-09-02 NOTE — Assessment & Plan Note (Signed)
She has morbid obesity where we will restart her on adipex but 1/2 dose daily.  I want her to do more cardio with her exercise and continue to eat healthy.  Our goal will be to lose 2-3 lbs per month with her medication and lifestyle modification.

## 2022-09-22 ENCOUNTER — Encounter: Payer: Self-pay | Admitting: Internal Medicine

## 2022-09-22 ENCOUNTER — Ambulatory Visit: Payer: 59 | Admitting: Internal Medicine

## 2022-09-22 VITALS — BP 112/70 | HR 69 | Temp 98.2°F | Resp 16 | Ht 60.0 in | Wt 213.0 lb

## 2022-09-22 DIAGNOSIS — Z202 Contact with and (suspected) exposure to infections with a predominantly sexual mode of transmission: Secondary | ICD-10-CM | POA: Diagnosis not present

## 2022-09-22 NOTE — Assessment & Plan Note (Signed)
We will do urine and blood testing where we will check for GC/chl, trich, HIV, syphillis and she wants to do HSV blood testing.  Sex education given.

## 2022-09-22 NOTE — Progress Notes (Signed)
Office Visit  Subjective   Patient ID: Batoul Mcbratney   DOB: [birth year]   Age: 27 y.o.   MRN: [id number]   Chief Complaint Chief Complaint  Patient presents with   Acute Visit    Wants STD check     History of Present Illness Mrs. Rowekamp is a 27 yo female who comes in today for STD check.  She states her boyfriend has slept with another woman and she comes in today to be checked.  She denies any symptoms and states she is not having any dysuria, hematuria, abdominal pain, vaginal discharge, n/v, fevers or other problems.  She does not use condoms with her boyfriend.     Past Medical History Past Medical History:  Diagnosis Date   Depression    GAD (generalized anxiety disorder)    Seasonal allergies      Allergies Allergies  Allergen Reactions   Penicillins Rash     Medications  Current Outpatient Medications:    cetirizine (ZYRTEC) 10 MG tablet, Take 10 mg by mouth daily., Disp: , Rfl:    DULoxetine (CYMBALTA) 30 MG capsule, Take 1 capsule (30 mg total) by mouth daily., Disp: 30 capsule, Rfl: 1   hydrOXYzine (VISTARIL) 25 MG capsule, Take 1 capsule (25 mg total) by mouth daily as needed., Disp: 30 capsule, Rfl: 1   ibuprofen (ADVIL,MOTRIN) 800 MG tablet, Take 1 tablet (800 mg total) by mouth every 6 (six) hours as needed for moderate pain., Disp: 20 tablet, Rfl: 0   medroxyPROGESTERone (PROVERA) 10 MG tablet, Take 1 tablet (10 mg total) by mouth daily., Disp: 7 tablet, Rfl: 0   ondansetron (ZOFRAN ODT) 4 MG disintegrating tablet, '4mg'$  ODT q4 hours prn nausea/vomit, Disp: 4 tablet, Rfl: 0   phentermine (ADIPEX-P) 37.5 MG capsule, Take 1/2 tab po daily, Disp: 15 capsule, Rfl: 1   Review of Systems Review of Systems  Constitutional:  Negative for chills and fever.  Gastrointestinal:  Negative for abdominal pain, nausea and vomiting.  Genitourinary:  Negative for dysuria, frequency, hematuria and urgency.  Musculoskeletal:  Negative for back pain.       Objective:     Vitals BP 112/70   Pulse 69   Temp 98.2 F (36.8 C)   Resp 16   Ht 5' (1.524 m)   Wt 213 lb (96.6 kg)   SpO2 98%   BMI 41.60 kg/m    Physical Examination Physical Exam Constitutional:      Appearance: Normal appearance. She is not ill-appearing.  Cardiovascular:     Rate and Rhythm: Normal rate and regular rhythm.     Pulses: Normal pulses.     Heart sounds: No murmur heard.    No friction rub. No gallop.  Pulmonary:     Effort: Pulmonary effort is normal. No respiratory distress.     Breath sounds: No wheezing, rhonchi or rales.  Abdominal:     General: Bowel sounds are normal. There is no distension.     Palpations: Abdomen is soft.     Tenderness: There is no abdominal tenderness.  Musculoskeletal:     Right lower leg: No edema.     Left lower leg: No edema.  Skin:    General: Skin is warm and dry.     Findings: No rash.  Neurological:     Mental Status: She is alert.        Assessment & Plan:   Encounter for assessment of STD exposure We will do urine and blood testing  where we will check for GC/chl, trich, HIV, syphillis and she wants to do HSV blood testing.  Sex education given.    No follow-ups on file.   Townsend Roger, MD

## 2022-09-23 LAB — HIV ANTIBODY (ROUTINE TESTING W REFLEX): HIV Screen 4th Generation wRfx: NONREACTIVE

## 2022-09-23 LAB — HSV 1 AND 2 AB, IGG
HSV 1 Glycoprotein G Ab, IgG: <0.91 index (ref 0.00–0.90)
HSV 2 IgG, Type Spec: <0.91 index (ref 0.00–0.90)

## 2022-09-23 LAB — RPR: RPR Ser Ql: NONREACTIVE

## 2022-09-25 LAB — CHLAMYDIA/GONOCOCCUS/TRICHOMONAS, NAA
Chlamydia by NAA: NEGATIVE
Gonococcus by NAA: NEGATIVE
Trich vag by NAA: NEGATIVE

## 2022-10-12 ENCOUNTER — Ambulatory Visit: Payer: 59 | Admitting: Internal Medicine

## 2022-10-12 ENCOUNTER — Encounter: Payer: Self-pay | Admitting: Internal Medicine

## 2022-10-12 VITALS — BP 122/70 | HR 78 | Temp 98.9°F | Resp 18 | Ht 60.0 in | Wt 209.0 lb

## 2022-10-12 DIAGNOSIS — R059 Cough, unspecified: Secondary | ICD-10-CM

## 2022-10-12 DIAGNOSIS — J209 Acute bronchitis, unspecified: Secondary | ICD-10-CM

## 2022-10-12 LAB — POC COVID19 BINAXNOW: SARS Coronavirus 2 Ag: NEGATIVE

## 2022-10-12 MED ORDER — GUAIFENESIN-CODEINE 100-10 MG/5ML PO SOLN: 10.0000 mL | Freq: Three times a day (TID) | ORAL | 0 refills | Status: DC | PRN

## 2022-10-12 MED ORDER — CETIRIZINE HCL 10 MG PO TABS: 10.0000 mg | ORAL_TABLET | Freq: Every day | ORAL | 1 refills | Status: AC

## 2022-10-12 MED ORDER — AZITHROMYCIN 250 MG PO TABS: ORAL_TABLET | ORAL | 0 refills | Status: AC

## 2022-10-12 NOTE — Assessment & Plan Note (Signed)
She will drink plenty of water, as she is having worsening of symptoms and some wheeze, I will give antibiotic. if she is not better then she will call. Her COVID test is negative.

## 2022-10-12 NOTE — Progress Notes (Signed)
   Acute Office Visit  Subjective:     Patient ID: Vanessa Christian, female    DOB: 05/14/1996, 27 y.o.   MRN: [id number]  Chief Complaint  Patient presents with   office visit    Coughing, congested     HPI Patient is in today for upper respiratory infection started 7 days ago. 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 She says at night she can not sleep because of stuffy nose and no fever or chills. Her symptoms are getting worse.   Review of Systems  HENT:  Positive for congestion.   Respiratory:  Positive for cough.   Cardiovascular: Negative.   Gastrointestinal: Negative.         Objective:    BP 122/70   Pulse 78   Temp 98.9 F (37.2 C)   Resp 18   Ht 5' (1.524 m)   Wt 209 lb (94.8 kg)   SpO2 98%   BMI 40.82 kg/m    Physical Exam Constitutional:      Appearance: Normal appearance.  Cardiovascular:     Rate and Rhythm: Normal rate and regular rhythm.     Heart sounds: Normal heart sounds.  Pulmonary:     Effort: Pulmonary effort is normal.     Breath sounds: Normal breath sounds.  Abdominal:     General: Bowel sounds are normal.     Palpations: Abdomen is soft.  Neurological:     Mental Status: She is alert.     Results for orders placed or performed in visit on 10/12/22  POC COVID-19 BinaxNow  Result Value Ref Range   SARS Coronavirus 2 Ag Negative Negative        Assessment & Plan:   Problem List Items Addressed This Visit       Respiratory   Acute bronchitis    She will drink plenty of water, as she is having worsening of symptoms and some wheeze, I will give antibiotic. if she is not better then she will call. Her COVID test is negative.      Other Visit Diagnoses     Cough, unspecified type    -  Primary   Relevant Orders   POC COVID-19 BinaxNow (Completed)       No orders of the defined types were placed in this encounter.   No follow-ups on file.  Garwin Brothers, MD

## 2022-10-29 ENCOUNTER — Encounter: Payer: Self-pay | Admitting: Internal Medicine

## 2022-10-29 ENCOUNTER — Ambulatory Visit: Payer: 59 | Admitting: Internal Medicine

## 2022-10-29 VITALS — BP 118/70 | HR 76 | Temp 97.5°F | Resp 16 | Ht 60.0 in | Wt 213.6 lb

## 2022-10-29 DIAGNOSIS — Z6841 Body Mass Index (BMI) 40.0 and over, adult: Secondary | ICD-10-CM

## 2022-10-29 DIAGNOSIS — F411 Generalized anxiety disorder: Secondary | ICD-10-CM | POA: Diagnosis not present

## 2022-10-29 DIAGNOSIS — F410 Panic disorder [episodic paroxysmal anxiety] without agoraphobia: Secondary | ICD-10-CM | POA: Diagnosis not present

## 2022-10-29 MED ORDER — PHENTERMINE HCL 37.5 MG PO CAPS: ORAL_CAPSULE | ORAL | 1 refills | Status: AC

## 2022-10-29 MED ORDER — BUPROPION HCL ER (XL) 150 MG PO TB24: 150.0000 mg | ORAL_TABLET | Freq: Every day | ORAL | 1 refills | Status: AC

## 2022-10-29 NOTE — Assessment & Plan Note (Signed)
I am going to restart her on bupropion XL 150mg  daily which she was previously on.  We will continue the hydroxyzine as needed.

## 2022-10-29 NOTE — Assessment & Plan Note (Signed)
Plan as above.  

## 2022-10-29 NOTE — Assessment & Plan Note (Signed)
She has morbid obesity where we will refill her adipex but 1/2 dose daily.  I want her to do more cardio with her exercise and continue to eat healthy.  Our goal will be to lose 2-3 lbs per month with her medication and lifestyle modification.  I will see her back in 2 months.

## 2022-10-29 NOTE — Progress Notes (Signed)
Office Visit  Subjective   Patient ID: Vanessa Christian   DOB: [birth year]   Age: 27 y.o.   MRN: [id number]   Chief Complaint No chief complaint on file.    History of Present Illness The patient is a 27 year old African American/Black female who presents returns for followup of her generalized anxiety disorder.  We saw her 2 months ago where I felt her anxiety and panic attacks had worsened.  I started her on a trial of cymbalta 30mg  daily and placed her on hydroxyzine as needed for panic attacks.  Unfortunately, the cymbalta make her extremely nauseated and she stopped this medication.  She states the hydroxyzine does help with her panic attacks.  She states she was diagnosed with GAD around 2017 and was started on medications at that time.  She was previously on Bupropion XL 150mg  daily but she states that my NP had started her on duloxetine 20mg  daily.  However, the patient tells me today that she had the Bupriopion "accidentally filled and was taking that" but ran out the medication 2-3 weeks ago.  She denies any side effects from her bupropion.  She states that she is having some depression with decreased concentration along with fatigue, and she does have some social withdrawal and loss of pleasure in things she likes.  She denies any feelings of guilt, isolation or worthlessness, no insomnia, weight loss or loss of appetite, suicidal ideation, homicidal ideation. This patient feels that she is able to care for herself. She currently lives with her roommate.  She does have a therapist but has not seen them in a while but she has an appointment coming up on 09/10/2022.   Patient is a 27 year old African American/Black female who presents today to discuss weight management. I saw her 2 months ago and gave her 2 months of adipex 1/2 tab daily and she has not lost any weight since her last visit.  She admits today she has not exercised in the last month.  She has been on and off adipex for the last  several years.  She states that she has noted some irritability with use of adipex and is requesting to go to 1/2 dose.  She has not had any other side effects from her adipex.   She does watch what she eats and denies any soft drinks or sweet tea.  She does exercise by weight lifting but does not do cardio.        Past Medical History Past Medical History:  Diagnosis Date   Depression    GAD (generalized anxiety disorder)    Seasonal allergies      Allergies Allergies  Allergen Reactions   Penicillins Rash     Medications  Current Outpatient Medications:    clindamycin (CLEOCIN) 300 MG capsule, Take 300 mg by mouth every 6 (six) hours., Disp: , Rfl:    cetirizine (ZYRTEC) 10 MG tablet, Take 1 tablet (10 mg total) by mouth at bedtime., Disp: 30 tablet, Rfl: 1   hydrOXYzine (VISTARIL) 25 MG capsule, Take 1 capsule (25 mg total) by mouth daily as needed., Disp: 30 capsule, Rfl: 1   ibuprofen (ADVIL,MOTRIN) 800 MG tablet, Take 1 tablet (800 mg total) by mouth every 6 (six) hours as needed for moderate pain., Disp: 20 tablet, Rfl: 0   medroxyPROGESTERone (PROVERA) 10 MG tablet, Take 1 tablet (10 mg total) by mouth daily., Disp: 7 tablet, Rfl: 0   ondansetron (ZOFRAN ODT) 4 MG disintegrating tablet,  4mg  ODT q4 hours prn nausea/vomit, Disp: 4 tablet, Rfl: 0   phentermine (ADIPEX-P) 37.5 MG capsule, Take 1/2 tab po daily, Disp: 15 capsule, Rfl: 1   Review of Systems Review of Systems  Constitutional:  Negative for chills and fever.  Eyes:  Negative for blurred vision and double vision.  Respiratory:  Negative for cough and shortness of breath.   Cardiovascular:  Negative for chest pain, palpitations and leg swelling.  Gastrointestinal:  Negative for abdominal pain, constipation, diarrhea, nausea and vomiting.  Musculoskeletal:  Negative for myalgias.  Neurological:  Negative for dizziness, weakness and headaches.  Psychiatric/Behavioral:  Negative for depression.        Objective:     Vitals BP 118/70   Pulse 76   Temp (!) 97.5 F (36.4 C)   Resp 16   Ht 5' (1.524 m)   Wt 213 lb 9.6 oz (96.9 kg)   SpO2 97%   BMI 41.72 kg/m    Physical Examination Physical Exam Constitutional:      Appearance: Normal appearance. She is not ill-appearing.  Cardiovascular:     Rate and Rhythm: Normal rate and regular rhythm.     Pulses: Normal pulses.     Heart sounds: No murmur heard.    No friction rub. No gallop.  Pulmonary:     Effort: Pulmonary effort is normal. No respiratory distress.     Breath sounds: No wheezing, rhonchi or rales.  Abdominal:     General: Bowel sounds are normal. There is no distension.     Palpations: Abdomen is soft.     Tenderness: There is no abdominal tenderness.  Musculoskeletal:     Right lower leg: No edema.     Left lower leg: No edema.  Skin:    General: Skin is warm and dry.     Findings: No rash.  Neurological:     Mental Status: She is alert.  Psychiatric:        Mood and Affect: Mood normal.        Behavior: Behavior normal.        Assessment & Plan:   BMI 40.0-44.9, adult (HCC) She has morbid obesity where we will refill her adipex but 1/2 dose daily.  I want her to do more cardio with her exercise and continue to eat healthy.  Our goal will be to lose 2-3 lbs per month with her medication and lifestyle modification.  I will see her back in 2 months.    Morbid obesity (HCC) Plan as above.  GAD (generalized anxiety disorder) I am going to restart her on bupropion XL 150mg  daily which she was previously on.  We will continue the hydroxyzine as needed.  Panic disorder Plan as above.    Return in about 2 months (around 12/29/2022).   Crist Fat, MD

## 2022-11-16 ENCOUNTER — Ambulatory Visit: Payer: 59 | Admitting: Internal Medicine

## 2022-11-16 ENCOUNTER — Encounter: Payer: Self-pay | Admitting: Internal Medicine

## 2022-11-16 VITALS — BP 112/78 | HR 69 | Temp 98.0°F | Resp 16 | Ht 60.0 in | Wt 212.6 lb

## 2022-11-16 DIAGNOSIS — K529 Noninfective gastroenteritis and colitis, unspecified: Secondary | ICD-10-CM | POA: Diagnosis not present

## 2022-11-16 MED ORDER — ONDANSETRON HCL 4 MG PO TABS: 4.0000 mg | ORAL_TABLET | Freq: Three times a day (TID) | ORAL | 0 refills | Status: AC | PRN

## 2022-11-16 NOTE — Progress Notes (Signed)
Office Visit  Subjective   Patient ID: Vanessa Christian   DOB: [birth year]   Age: 27 y.o.   MRN: [id number]   Chief Complaint Chief Complaint  Patient presents with   Acute Visit    N/V/D     History of Present Illness Vanessa Christian is a 27 yo female who comes in today with nausea and vomting that started 2-3 days ago.  This started on Saturday with nausea with stomach discomfort where she started having vomiting this morning at work.  She started with diarrhea yesterday where she is having diarrhea 2-3 times per day without blood or mucus.  She has vomited twice today.  She states she is drinking fluids but she is nauseated.  She has not tried anything OTC for this.       Past Medical History Past Medical History:  Diagnosis Date   Depression    GAD (generalized anxiety disorder)    Seasonal allergies      Allergies Allergies  Allergen Reactions   Penicillins Rash     Medications  Current Outpatient Medications:    buPROPion (WELLBUTRIN XL) 150 MG 24 hr tablet, Take 1 tablet (150 mg total) by mouth daily., Disp: 30 tablet, Rfl: 1   cetirizine (ZYRTEC) 10 MG tablet, Take 1 tablet (10 mg total) by mouth at bedtime., Disp: 30 tablet, Rfl: 1   hydrOXYzine (VISTARIL) 25 MG capsule, Take 1 capsule (25 mg total) by mouth daily as needed., Disp: 30 capsule, Rfl: 1   medroxyPROGESTERone (PROVERA) 10 MG tablet, Take 1 tablet (10 mg total) by mouth daily., Disp: 7 tablet, Rfl: 0   phentermine (ADIPEX-P) 37.5 MG capsule, Take 1/2 tab po daily, Disp: 15 capsule, Rfl: 1   Review of Systems Review of Systems  Constitutional:  Negative for chills and fever.  Respiratory:  Negative for cough, shortness of breath and wheezing.   Cardiovascular:  Negative for chest pain, palpitations and leg swelling.  Gastrointestinal:  Positive for diarrhea, nausea and vomiting. Negative for abdominal pain, blood in stool, constipation and melena.  Genitourinary:  Negative for dysuria, frequency and  hematuria.  Musculoskeletal:  Negative for myalgias.  Neurological:  Negative for dizziness, weakness and headaches.       Objective:    Vitals BP 112/78   Pulse 69   Temp 98 F (36.7 C)   Resp 16   Ht 5' (1.524 m)   Wt 212 lb 9.6 oz (96.4 kg)   SpO2 99%   BMI 41.52 kg/m    Physical Examination Physical Exam Constitutional:      Appearance: Normal appearance. She is not ill-appearing.  Cardiovascular:     Rate and Rhythm: Normal rate and regular rhythm.     Pulses: Normal pulses.     Heart sounds: No murmur heard.    No friction rub. No gallop.  Pulmonary:     Effort: Pulmonary effort is normal. No respiratory distress.     Breath sounds: No wheezing, rhonchi or rales.  Abdominal:     General: Bowel sounds are normal. There is no distension.     Palpations: Abdomen is soft.     Tenderness: There is no abdominal tenderness.  Musculoskeletal:     Right lower leg: No edema.     Left lower leg: No edema.  Skin:    General: Skin is warm and dry.     Findings: No rash.  Neurological:     Mental Status: She is alert.  Assessment & Plan:   Gastroenteritis She is having some n/v and diarrhea with probable gastroenteritis.  She states her mother just got over this at home.  I want her to drink fluids and we will give her zofran for n/v.    No follow-ups on file.   Crist Fat, MD

## 2022-11-16 NOTE — Assessment & Plan Note (Signed)
She is having some n/v and diarrhea with probable gastroenteritis.  She states her mother just got over this at home.  I want her to drink fluids and we will give her zofran for n/v.

## 2023-01-08 ENCOUNTER — Ambulatory Visit: Payer: 59 | Admitting: Internal Medicine
